# Patient Record
Sex: Female | Born: 2000 | Race: White | Hispanic: Yes | Marital: Single | Smoking: Never smoker
Health system: Southern US, Community
[De-identification: ages and names within clinical notes are randomized; demographics above are authoritative.]

---
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
## 2021-01-21 ENCOUNTER — Other Ambulatory Visit: Payer: Self-pay | Admitting: Family Medicine

## 2021-01-21 DIAGNOSIS — Z309 Encounter for contraceptive management, unspecified: Secondary | ICD-10-CM
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
## 2021-10-02 ENCOUNTER — Other Ambulatory Visit (HOSPITAL_COMMUNITY)
Admission: RE | Admit: 2021-10-02 | Discharge: 2021-10-02 | Disposition: A | Discharge status: Home / Self Care | Payer: Medicaid Other | Source: Ambulatory Visit | Attending: Family Medicine | Admitting: Family Medicine

## 2021-10-02 ENCOUNTER — Ambulatory Visit (INDEPENDENT_AMBULATORY_CARE_PROVIDER_SITE_OTHER): Payer: Medicaid Other | Admitting: Family Medicine

## 2021-10-02 ENCOUNTER — Encounter: Payer: Self-pay | Admitting: Family Medicine

## 2021-10-02 VITALS — BP 102/68 | HR 79 | Ht 60.24 in | Wt 152.0 lb

## 2021-10-02 DIAGNOSIS — Z113 Encounter for screening for infections with a predominantly sexual mode of transmission: Secondary | ICD-10-CM

## 2021-10-02 DIAGNOSIS — Z0000 Encounter for general adult medical examination without abnormal findings: Secondary | ICD-10-CM | POA: Diagnosis not present

## 2021-10-02 NOTE — Patient Instructions (Signed)

## 2021-10-03 DIAGNOSIS — Z0000 Encounter for general adult medical examination without abnormal findings: Secondary | ICD-10-CM | POA: Insufficient documentation

## 2021-10-03 LAB — RPR: RPR Ser Ql: NONREACTIVE

## 2021-10-03 LAB — HIV ANTIBODY (ROUTINE TESTING W REFLEX): HIV Screen 4th Generation wRfx: NONREACTIVE

## 2021-10-04 LAB — CERVICOVAGINAL ANCILLARY ONLY
Chlamydia: NEGATIVE
Comment: NEGATIVE
Comment: NEGATIVE
Comment: NORMAL
Neisseria Gonorrhea: NEGATIVE
Trichomonas: NEGATIVE

## 2021-10-18 ENCOUNTER — Other Ambulatory Visit: Payer: Self-pay | Admitting: Family Medicine

## 2021-10-18 DIAGNOSIS — Z309 Encounter for contraceptive management, unspecified: Secondary | ICD-10-CM

## 2021-12-01 ENCOUNTER — Ambulatory Visit (INDEPENDENT_AMBULATORY_CARE_PROVIDER_SITE_OTHER): Payer: Medicaid Other | Admitting: Student

## 2021-12-01 VITALS — BP 110/60 | HR 74 | Ht 60.0 in | Wt 147.0 lb

## 2021-12-01 DIAGNOSIS — N390 Urinary tract infection, site not specified: Secondary | ICD-10-CM

## 2021-12-01 DIAGNOSIS — R300 Dysuria: Secondary | ICD-10-CM | POA: Diagnosis not present

## 2021-12-01 HISTORY — DX: Urinary tract infection, site not specified: N39.0

## 2021-12-01 LAB — POCT URINALYSIS DIP (MANUAL ENTRY)
Bilirubin, UA: NEGATIVE
Glucose, UA: NEGATIVE mg/dL
Ketones, POC UA: NEGATIVE mg/dL
Nitrite, UA: POSITIVE — AB
Protein Ur, POC: >=300 mg/dL — AB
Spec Grav, UA: >=1.03 — AB (ref 1.010–1.025)
Urobilinogen, UA: 0.2 E.U./dL
pH, UA: 6 (ref 5.0–8.0)

## 2021-12-01 LAB — POCT UA - MICROSCOPIC ONLY: Epithelial cells, urine per micros: >20

## 2021-12-01 MED ORDER — NITROFURANTOIN MONOHYD MACRO 100 MG PO CAPS: 100.0000 mg | ORAL_CAPSULE | Freq: Two times a day (BID) | ORAL | 0 refills | Status: AC

## 2021-12-01 NOTE — Patient Instructions (Signed)
It was great to see you today! Thank you for choosing [facility] for your primary care. Gail Shaw was seen for burning with urination.  Today we addressed: I suspect this is a standard UTI (as evidenced by your urine testing and symptoms) and not bladder or kidney infection.  I prescribed you Macrobid 100 mg twice daily x5 days.  Please take all of these to ensure that the UTI resolves.  Should your symptoms not resolve or come back after finishing antibiotic treatment, please return.  If you haven't already, sign up for My Chart to have easy access to your labs results, and communication with your primary care physician.  You should return to our clinic Return if symptoms worsen or fail to improve.  Please arrive 15 minutes before your appointment to ensure smooth check in process.  We appreciate your efforts in making this happen.  Please call the clinic at [phone] if your symptoms worsen or you have any concerns.  Thank you for allowing me to participate in your care, Shelby Mattocks, DO 12/01/2021, 10:15 AM PGY-2, [facility]

## 2021-12-01 NOTE — Assessment & Plan Note (Addendum)
UA with leuks +1 and positive nitrites and clinical suspicion of UTI given history.  Will treat as uncomplicated UTI not involving upper tract with Macrobid 100 mg twice daily x5 days.

## 2021-12-01 NOTE — Progress Notes (Signed)
  SUBJECTIVE:   CHIEF COMPLAINT / HPI:   DYSURIA  Pain urinating started 5 days ago. Urinating more frequently. Denies back pain. Pain is: suprapubic, hurts during urination, burning even after urination, will wake her up some times Medications tried: baking soda with water, chamomile tea Any antibiotics in the last 30 days: no More than 3 UTIs in the last 12 months: no STD exposure: no Possibly pregnant: no  Symptoms Urgency: yes Frequency: yes Blood in urine: no Pain in back: no Fever: no Vaginal discharge: unsure Mouth Ulcers: no  PERTINENT  PMH / PSH: N/A  OBJECTIVE:  BP 110/60   Pulse 74   Ht 5' (1.524 m)   Wt 147 lb (66.7 kg)   LMP 11/17/2021   SpO2 98%   BMI 28.71 kg/m   General: NAD, pleasant, able to participate in exam Cardiac: RRR, no murmurs auscultated Respiratory: CTAB, normal WOB Abdomen: soft, non-tender, non-distended, no suprapubic or CVA tenderness Psych: Normal affect and mood  ASSESSMENT/PLAN:  Acute UTI UA with leuks +1 and positive nitrites and clinical suspicion of UTI given history.  Will treat as uncomplicated UTI not involving upper tract with Macrobid 100 mg twice daily x5 days.  Return if symptoms worsen or fail to improve. Shelby Mattocks, DO 12/01/2021, 10:18 AM PGY-2, [facility]

## 2022-02-25 ENCOUNTER — Encounter (HOSPITAL_COMMUNITY): Payer: Self-pay | Admitting: Emergency Medicine

## 2022-02-25 ENCOUNTER — Ambulatory Visit (INDEPENDENT_AMBULATORY_CARE_PROVIDER_SITE_OTHER): Payer: Medicaid Other

## 2022-02-25 ENCOUNTER — Ambulatory Visit (HOSPITAL_COMMUNITY)
Admission: EM | Admit: 2022-02-25 | Discharge: 2022-02-25 | Disposition: A | Discharge status: Home / Self Care | Payer: Medicaid Other | Attending: Family Medicine | Admitting: Family Medicine

## 2022-02-25 DIAGNOSIS — M25571 Pain in right ankle and joints of right foot: Secondary | ICD-10-CM

## 2022-02-25 MED ORDER — KETOROLAC TROMETHAMINE 30 MG/ML IJ SOLN
INTRAMUSCULAR | Status: AC
  Filled 2022-02-25: qty 1

## 2022-02-25 MED ORDER — KETOROLAC TROMETHAMINE 30 MG/ML IJ SOLN
30.0000 mg | Freq: Once | INTRAMUSCULAR | Status: AC
  Administered 2022-02-25: 30 mg via INTRAMUSCULAR

## 2022-02-25 MED ORDER — IBUPROFEN 800 MG PO TABS: 800.0000 mg | ORAL_TABLET | Freq: Three times a day (TID) | ORAL | 0 refills | Status: DC | PRN

## 2022-02-25 NOTE — ED Provider Notes (Addendum)
Gail Shaw    CSN: [id number] Arrival date & time: 02/25/22  1330      History   Chief Complaint Chief Complaint  Patient presents with   Ankle Pain    HPI Gail Shaw is a 21 y.o. female.    Ankle Pain  Here for right ankle pain.  Yesterday she was at a trampoline park, and after jumping really high she had come down wrong onto her lateral side of her right ankle.  It began to swell overnight.  It hurts to bear weight on it.  Last menstrual cycle is now.  History reviewed. No pertinent past medical history.  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 No pertinent surgical history.  OB History   No obstetric history on file.      Home Medications    Prior to Admission medications   Medication Sig Start Date End Date Taking? Authorizing Provider  ibuprofen (ADVIL) 800 MG tablet Take 1 tablet (800 mg total) by mouth every 8 (eight) hours as needed (pain). 02/25/22  Yes Barrett Henle, MD  norgestimate-ethinyl estradiol (ORTHO-CYCLEN) 0.25-35 MG-MCG tablet TAKE 1 TABLET BY MOUTH EVERY DAY 10/18/21   Simmons-Robinson, Riki Sheer, MD    Family History History reviewed. No pertinent family history.  Social History Social History   Tobacco Use   Smoking status: Never   Smokeless tobacco: Never     Allergies   Patient has no known allergies.   Review of Systems Review of Systems   Physical Exam Triage Vital Signs ED Triage Vitals  Enc Vitals Group     BP 02/25/22 1438 116/86     Pulse Rate 02/25/22 1438 81     Resp 02/25/22 1438 17     Temp 02/25/22 1438 98.1 F (36.7 C)     Temp Source 02/25/22 1438 Oral     SpO2 02/25/22 1438 97 %     Weight --      Height --      Head  Circumference --      Peak Flow --      Pain Score 02/25/22 1439 7     Pain Loc --      Pain Edu? --      Excl. in [place]? --    No data found.  Updated Vital Signs BP 116/86 (BP Location: Left Arm)   Pulse 81   Temp 98.1 F (36.7 C) (Oral)   Resp 17   SpO2 97%   Visual Acuity Right Eye Distance:   Left Eye Distance:   Bilateral Distance:    Right Eye Near:   Left Eye Near:    Bilateral Near:     Physical Exam Vitals reviewed.  Constitutional:      General: She is not in acute distress.    Appearance: She is not ill-appearing, toxic-appearing or diaphoretic.  Musculoskeletal:     Comments: There is swelling and tenderness in the the right lateral malleolus and anterior and inferior to the malleolus.  Skin:    Coloration: Skin is not jaundiced or pale.  Neurological:     General: No focal deficit present.     Mental Status: She is alert and oriented to person, place, and time.  Psychiatric:  Behavior: Behavior normal.      UC Treatments / Results  Labs (all labs ordered are listed, but only abnormal results are displayed) Labs Reviewed - No data to display  EKG   Radiology DG Ankle Complete Right  Result Date: 02/25/2022 CLINICAL DATA:  Right ankle pain, inversion injury ester day EXAM: RIGHT ANKLE - COMPLETE 3+ VIEW COMPARISON:  None FINDINGS: Abnormal soft tissue swelling overlying the lateral malleolus and beneath both malleoli. Tibiotalar joint effusion. Well corticated ossicle just below the lateral malleolus measures 0.8 by 0.4 cm, given the well corticated appearance I am skeptical that this is an acute fracture but could be from old injury or unfused ossification center. The plafond and talar dome appear intact. IMPRESSION: 1. Soft tissue swelling overlying the lateral malleolus and beneath both malleoli. 2. Tibiotalar joint effusion. 3. Well corticated ossicle just below the lateral malleolus measures 0.8 x 0.4 cm; given the well corticated appearance  I am skeptical that this is an acute fracture. 4. If pain persists despite conservative therapy, MRI may be warranted for further characterization. Electronically Signed   By: Van Clines M.D.   On: 02/25/2022 15:32    Procedures Procedures (including critical care time)  Medications Ordered in UC Medications  ketorolac (TORADOL) 30 MG/ML injection 30 mg (has no administration in time range)    Initial Impression / Assessment and Plan / UC Course  I have reviewed the triage vital signs and the nursing notes.  Pertinent labs & imaging results that were available during my care of the patient were reviewed by me and considered in my medical decision making (see chart for details).       There is a well-corticated piece of bone inferior to the lateral malleolus; radiology thinks this may be an old injury.  She has had a previous injury of her ankle, but it was not documented as a fracture in the past.  She is provided Ace wrap and crutches and pain relief.  She is given contact information for orthopedics so that if she is not improving she can see them.  Final Clinical Impressions(s) / UC Diagnoses   Final diagnoses:  Acute right ankle pain     Discharge Instructions      X-ray shows possible new fracture versus an old injury.  Ice and elevate this ankle in the next 48 hours  You have been given a shot of Toradol 30 mg today.  Take ibuprofen 800 mg--1 tab every 8 hours as needed for pain.  See orthopedics if you are not improving quickly     ED Prescriptions     Medication Sig Dispense Auth. Provider   ibuprofen (ADVIL) 800 MG tablet Take 1 tablet (800 mg total) by mouth every 8 (eight) hours as needed (pain). 21 tablet Tanette Chauca, Gwenlyn Perking, MD      PDMP not reviewed this encounter.   Barrett Henle, MD 02/25/22 1542    Barrett Henle, MD 02/25/22 [phone]

## 2022-02-25 NOTE — ED Triage Notes (Signed)
Pt presents with right ankle pain after jumping on a trampoline and landing on ankle. States pain increases when applying pressure to ankle and trying to walk. Has not taken any OTC medication for pain.

## 2022-02-25 NOTE — Discharge Instructions (Addendum)
X-ray shows possible new fracture versus an old injury.  Ice and elevate this ankle in the next 48 hours  You have been given a shot of Toradol 30 mg today.  Take ibuprofen 800 mg--1 tab every 8 hours as needed for pain.  See orthopedics if you are not improving quickly

## 2023-02-23 ENCOUNTER — Ambulatory Visit: Payer: Medicaid Other

## 2023-02-24 ENCOUNTER — Ambulatory Visit: Payer: Medicaid Other

## 2023-03-08 ENCOUNTER — Ambulatory Visit (HOSPITAL_COMMUNITY)
Admission: EM | Admit: 2023-03-08 | Discharge: 2023-03-08 | Disposition: A | Discharge status: Home / Self Care | Payer: Medicaid Other | Attending: Emergency Medicine | Admitting: Emergency Medicine

## 2023-03-08 ENCOUNTER — Encounter (HOSPITAL_COMMUNITY): Payer: Self-pay

## 2023-03-08 DIAGNOSIS — R21 Rash and other nonspecific skin eruption: Secondary | ICD-10-CM

## 2023-03-08 DIAGNOSIS — Z3202 Encounter for pregnancy test, result negative: Secondary | ICD-10-CM | POA: Diagnosis not present

## 2023-03-08 DIAGNOSIS — J069 Acute upper respiratory infection, unspecified: Secondary | ICD-10-CM | POA: Diagnosis not present

## 2023-03-08 DIAGNOSIS — R0981 Nasal congestion: Secondary | ICD-10-CM

## 2023-03-08 LAB — POCT URINE PREGNANCY: Preg Test, Ur: NEGATIVE

## 2023-03-08 MED ORDER — FLUTICASONE PROPIONATE 50 MCG/ACT NA SUSP: 2.0000 | Freq: Every day | NASAL | 0 refills | Status: DC

## 2023-03-08 MED ORDER — TRIAMCINOLONE ACETONIDE 0.1 % EX CREA: 1.0000 | TOPICAL_CREAM | Freq: Two times a day (BID) | CUTANEOUS | 0 refills | Status: AC

## 2023-03-08 NOTE — ED Provider Notes (Signed)
HPI  SUBJECTIVE:  Gail Shaw is a 22 y.o. female who presents with 2 issues: First, she reports a red, pruritic flat rash on her neck and face starting yesterday that "feels like a sunburn".  She reports textured skin over her face and states that her face feels tight.  States that her neck and face feel slightly swollen.  It is not painful.  She does not have this rash anywhere else.  Denies sun exposure.  No new cosmetics, facial wash, lotions, soaps, detergents, recent change in medications, recent antibiotics.  No tongue or lip swelling, sensation of throat swelling shut, voice changes, urticaria, wheezing or shortness of breath, nausea, vomiting, diarrhea, abdominal pain, lightheadedness, syncope.  She states the rash started several hours after eating a normal meal.  No aggravating or alleviating factors.  She also reports 4 to 5 days of nasal congestion, clear rhinorrhea, mild, intermittent headaches, itchy, watery eyes, sneezing.  States that she has felt feverish, but has not checked her temperature.  No body aches, sinus pain or pressure, upper dental pain, postnasal drip, sore throat, coughing, wheezing, shortness of breath.  She has multiple family members were sick with URI symptoms.  No known COVID or flu exposure.  She has no past medical history-no history of allergies, anaphylaxis.  LMP: October 17.  Unsure if she could be pregnant.  PCP: [facility].    History reviewed. No pertinent past medical history.  History reviewed. No pertinent surgical history.  History reviewed. No pertinent family history.  Social History   Tobacco Use   Smoking status: Never   Smokeless tobacco: Never    No current facility-administered medications for this encounter.  Current Outpatient Medications:    fluticasone (FLONASE) 50 MCG/ACT nasal spray, Place 2 sprays into both nostrils daily., Disp: 16 g, Rfl: 0   triamcinolone cream (KENALOG) 0.1 %, Apply 1  Application topically 2 (two) times daily. Apply for 2 weeks. May use on face, Disp: 30 g, Rfl: 0   norgestimate-ethinyl estradiol (ORTHO-CYCLEN) 0.25-35 MG-MCG tablet, TAKE 1 TABLET BY MOUTH EVERY DAY, Disp: 84 tablet, Rfl: 2  No Known Allergies   ROS  As noted in HPI.   Physical Exam  BP 116/79 (BP Location: Left Arm)   Pulse 82   Temp 98.2 F (36.8 C) (Oral)   Resp 18   LMP 02/20/2023   SpO2 98%   Constitutional: Well developed, well nourished, no acute distress Eyes:  EOMI, conjunctiva normal bilaterally HENT: Normocephalic, atraumatic,mucus membranes moist.  Extensive clear rhinorrhea.  Thematous, swollen turbinates.  No maxillary, frontal sinus tenderness.  No appreciable facial swelling.  Normal tonsils without exudates.  No erythema.  Positive cobblestoning, no postnasal drip.  No angioedema.  Airway widely patent. Neck: Positive cervical lymphadenopathy Respiratory: Normal inspiratory effort, lungs clear bilaterally, good air movement Cardiovascular: Normal rate, regular rhythm, no murmurs rubs or gallops GI: nondistended skin: No urticaria.  No rash over torso, arms.   Flat, blanchable, nontender rash over neck   Mild nontender erythema, textured skin over face   Musculoskeletal: no deformities Neurologic: Alert & oriented x 3, no focal neuro deficits Psychiatric: Speech and behavior appropriate   ED Course   Medications - No data to display  Orders Placed This Encounter  Procedures   POCT urine pregnancy    Standing Status:   Standing    Number of Occurrences:   1    Results for orders placed or performed during the hospital encounter of 03/08/23 (  from the past 24 hour(s))  POCT urine pregnancy     Status: None   Collection Time: 03/08/23  1:14 PM  Result Value Ref Range   Preg Test, Ur Negative Negative   No results found.  ED Clinical Impression  1. Rash   2. Upper respiratory tract infection, unspecified type   3. Nasal congestion       ED Assessment/Plan    Urine pregnancy negative.  1.  Rash.  Unsure as to the etiology, but does not appear to be a severe allergic reaction.  She has no angioedema, evidence of anaphylaxis.  Denies sun exposure.  Her vitals are normal, lungs are clear.  Will send home with triamcinolone cream.  2.  Nasal congestion-she has multiple sick contacts with URI symptoms, however, she is reporting itchy, watery eyes and sneezing.  Suspect allergies versus upper respiratory infection.  Will have her try an antihistamine/decongestant combination, and if that does not work, then switch to [employer] D in a few days.  Flonase, saline nasal irrigation.  Discussed labs, MDM, treatment plan, and plan for follow-up with patient. Discussed sn/sx that should prompt return to the ED. patient agrees with plan.   Meds ordered this encounter  Medications   triamcinolone cream (KENALOG) 0.1 %    Sig: Apply 1 Application topically 2 (two) times daily. Apply for 2 weeks. May use on face    Dispense:  30 g    Refill:  0   fluticasone (FLONASE) 50 MCG/ACT nasal spray    Sig: Place 2 sprays into both nostrils daily.    Dispense:  16 g    Refill:  0      *This clinic note was created using Scientist, clinical (histocompatibility and immunogenetics). Therefore, there may be occasional mistakes despite careful proofreading.  ?   Domenick Gong, MD 03/08/23 1317

## 2023-03-08 NOTE — Discharge Instructions (Signed)
Urine pregnancy is negative.  Apply the triamcinolone to your neck and face twice a day.  This is a steroid cream which will help with inflammation and itching.  You can try an antihistamine/decongestant commendation such as Claritin-D, Allegra-D, Zyrtec-D for the nasal congestion.  This also may help with the rash.  If the nasal congestion does not respond to antihistamine/decongestion combination in a day or 2, then switch to Mucinex D.Flonase, saline nasal irrigation with a NeilMed sinus rinse and distilled water as often as you want to prevent a bacterial sinus infection.  Go to the ER for lip or tongue swelling, nausea, vomiting, abdominal pain, diarrhea, wheezing, shortness of breath, sensation of throat swelling shut, hives all over your body, if you pass out.

## 2023-03-08 NOTE — ED Triage Notes (Signed)
Pt is here for cough and nasal congestion x 3 days. Pt reports a rash on her neck and stomach that started yesterday.

## 2023-06-18 ENCOUNTER — Encounter: Payer: Self-pay | Admitting: Student

## 2023-06-18 ENCOUNTER — Ambulatory Visit (INDEPENDENT_AMBULATORY_CARE_PROVIDER_SITE_OTHER): Payer: Medicaid Other | Admitting: Student

## 2023-06-18 VITALS — BP 98/70 | HR 103 | Temp 98.7°F | Ht 61.0 in | Wt 149.4 lb

## 2023-06-18 DIAGNOSIS — Z309 Encounter for contraceptive management, unspecified: Secondary | ICD-10-CM | POA: Diagnosis not present

## 2023-06-18 DIAGNOSIS — N6325 Unspecified lump in the left breast, overlapping quadrants: Secondary | ICD-10-CM | POA: Insufficient documentation

## 2023-06-18 DIAGNOSIS — N926 Irregular menstruation, unspecified: Secondary | ICD-10-CM

## 2023-06-18 LAB — POCT URINE PREGNANCY: Preg Test, Ur: NEGATIVE

## 2023-06-18 NOTE — Assessment & Plan Note (Signed)
Due to the patient being less than 22 years old we will order an ultrasound for workup.  Referred to the GI breast center for scheduling.  Reassured by negative family history however she does have some risk factors with early onset of menses.  Urine pregnancy checked at this visit and was negative.

## 2023-06-18 NOTE — Assessment & Plan Note (Signed)
Discussed contraception due to patient being sexually active without birth control.  Shared decision making with patient to schedule an appointment in March for Nexplanon placement.  Skin benefits discussed with patient and she plans to proceed with procedure.

## 2023-06-18 NOTE — Patient Instructions (Addendum)
It was great to see you today!   I am scheduling an ultrasound at [facility].   You are scheduled to come see me in March to get your nexplanon placed and to complete your pap smear.   Future Appointments  Date Time Provider Department Center  07/02/2023  2:10 PM Glendale Chard, DO [facility]    Please arrive 15 minutes before your appointment to ensure smooth check in process.    Please call the clinic at [phone] if your symptoms worsen or you have any concerns.  Thank you for allowing me to participate in your care, Dr. Glendale Chard [facility]

## 2023-06-18 NOTE — Progress Notes (Signed)
    SUBJECTIVE:   CHIEF COMPLAINT / HPI:   Gail Shaw is a 23 y.o. female  presenting for breast lump.   Found the lump 2 months ago. Seems to be growing. Patient thinks its a cyst. Started birth control 3 years ago, took for one year and then been off for 2 years.   Changes in breast appearance: no New skin changes: no New nipple inversion: no Nipple discharge: no Breast pain location or changes with menses: does not seem to be related to menstrual cycle and is not painful  Trauma: no  Hx of familial breast cancer: no Contraceptive pills: Not currently on contraception Age of first period: 11  Currently sexually active and not on birth control.  PERTINENT  PMH / PSH: Reviewed and updated   OBJECTIVE:   BP 98/70   Pulse (!) 103   Temp 98.7 F (37.1 C)   Ht 5\' 1"  (1.549 m)   Wt 149 lb 6.4 oz (67.8 kg)   SpO2 97%   BMI 28.23 kg/m   Well-appearing, no acute distress Cardio: Regular rate, regular  rhythm, no murmurs on exam. Pulm: Clear, no wheezing, no crackles. No increased work of breathing Abdominal: bowel sounds present, soft, non-tender, non-distended Extremities: no peripheral edema   Breast Exam: MA present.  Both breast examined without skin findings, nipple discharge or nipple inversion.  A 1 to 2 cm lump was found in the 3 o'clock position on the outer left breast that was mobile and nontender to the touch.     12/01/2021    9:50 AM 10/02/2021    3:56 PM 10/19/2020    9:13 AM  PHQ9 SCORE ONLY  PHQ-9 Total Score 1 2 0      ASSESSMENT/PLAN:   Mass overlapping multiple quadrants of left breast Due to the patient being less than 68 years old we will order an ultrasound for workup.  Referred to the GI breast center for scheduling.  Reassured by negative family history however she does have some risk factors with early onset of menses.  Urine pregnancy checked at this visit and was negative.  Contraception management Discussed contraception due  to patient being sexually active without birth control.  Shared decision making with patient to schedule an appointment in March for Nexplanon placement.  Skin benefits discussed with patient and she plans to proceed with procedure.   Health maintenance: At time of Nexplanon placement will also perform Pap smear.  Glendale Chard, DO [facility]

## 2023-06-19 ENCOUNTER — Encounter: Payer: Self-pay | Admitting: Student

## 2023-06-19 ENCOUNTER — Ambulatory Visit
Admission: RE | Admit: 2023-06-19 | Discharge: 2023-06-19 | Discharge status: Home / Self Care | Payer: Medicaid Other | Source: Ambulatory Visit | Attending: Family Medicine | Admitting: Family Medicine

## 2023-06-19 DIAGNOSIS — N6325 Unspecified lump in the left breast, overlapping quadrants: Secondary | ICD-10-CM | POA: Diagnosis not present

## 2023-06-22 ENCOUNTER — Other Ambulatory Visit: Payer: Self-pay | Admitting: Family Medicine

## 2023-06-22 DIAGNOSIS — D242 Benign neoplasm of left breast: Secondary | ICD-10-CM

## 2023-07-02 ENCOUNTER — Other Ambulatory Visit (HOSPITAL_COMMUNITY)
Admission: RE | Admit: 2023-07-02 | Discharge: 2023-07-02 | Disposition: A | Discharge status: Home / Self Care | Source: Ambulatory Visit | Attending: Family Medicine | Admitting: Family Medicine

## 2023-07-02 ENCOUNTER — Ambulatory Visit: Payer: Self-pay | Admitting: Student

## 2023-07-02 VITALS — BP 113/58 | HR 70 | Ht 61.0 in | Wt 148.4 lb

## 2023-07-02 DIAGNOSIS — Z124 Encounter for screening for malignant neoplasm of cervix: Secondary | ICD-10-CM

## 2023-07-02 DIAGNOSIS — Z113 Encounter for screening for infections with a predominantly sexual mode of transmission: Secondary | ICD-10-CM

## 2023-07-02 DIAGNOSIS — Z309 Encounter for contraceptive management, unspecified: Secondary | ICD-10-CM

## 2023-07-02 LAB — POCT URINE PREGNANCY: Preg Test, Ur: NEGATIVE

## 2023-07-02 MED ORDER — NORGESTIMATE-ETH ESTRADIOL 0.25-35 MG-MCG PO TABS: 1.0000 | ORAL_TABLET | Freq: Every day | ORAL | 2 refills | Status: AC

## 2023-07-02 NOTE — Assessment & Plan Note (Signed)
 Shared decision making with patient to restart OCPs after lengthy discussion on her options. Prescription sent to her pharmacy. She will need follow up in one year.

## 2023-07-02 NOTE — Progress Notes (Signed)
    SUBJECTIVE:   CHIEF COMPLAINT / HPI:   Gail Shaw is a 23 y.o. female  presenting for pap smear and birth control counseling.   Currently sexually active not using birth control. Is not desiring pregnancy at this time. Has previously been on OCPs. Last visit discussed nexplanon but patient has changed her mind and does not wish to proceed.   PERTINENT  PMH / PSH: Reviewed and updated   OBJECTIVE:   BP (!) 113/58   Pulse 70   Ht 5\' 1"  (1.549 m)   Wt 148 lb 6.4 oz (67.3 kg)   SpO2 100%   BMI 28.04 kg/m   Well-appearing, no acute distress Cardio: Regular rate, regular rhythm, no murmurs on exam. Pulm: Clear, no wheezing, no crackles. No increased work of breathing Abdominal: bowel sounds present, soft, non-tender, non-distended Extremities: no peripheral edema   Pelvic Exam: MA chaperone present  Normal external genitalia No abnormal discharge  No cervical motion tenderness  Cervix visualized with no lesions       07/02/2023    2:39 PM 12/01/2021    9:50 AM 10/02/2021    3:56 PM  PHQ9 SCORE ONLY  PHQ-9 Total Score 0 1 2      ASSESSMENT/PLAN:   Contraception management Shared decision making with patient to restart OCPs after lengthy discussion on her options. Prescription sent to her pharmacy. She will need follow up in one year.    Health Maintenance:  Pap smear collected today GC, HIV and RPR ordered today   Glendale Chard, DO [facility]

## 2023-07-02 NOTE — Patient Instructions (Signed)
 It was great to see you today!   I will send you a message with your results to MyChart.   I have sent a prescription of oral birth control pills to your pharmacy.   No future appointments.  Please arrive 15 minutes before your appointment to ensure smooth check in process.    Please call the clinic at [phone] if your symptoms worsen or you have any concerns.  Thank you for allowing me to participate in your care, Dr. Glendale Chard [facility]

## 2023-07-03 LAB — CERVICOVAGINAL ANCILLARY ONLY
Chlamydia: NEGATIVE
Comment: NEGATIVE
Comment: NORMAL
Neisseria Gonorrhea: NEGATIVE

## 2023-07-04 ENCOUNTER — Encounter: Payer: Self-pay | Admitting: Student

## 2023-07-04 LAB — CYTOLOGY - PAP
Adequacy: ABSENT
Diagnosis: NEGATIVE

## 2023-12-19 ENCOUNTER — Other Ambulatory Visit: Payer: Self-pay | Admitting: Family Medicine

## 2023-12-19 ENCOUNTER — Ambulatory Visit
Admission: RE | Admit: 2023-12-19 | Discharge: 2023-12-19 | Disposition: A | Discharge status: Home / Self Care | Source: Ambulatory Visit | Attending: Family Medicine | Admitting: Family Medicine

## 2023-12-19 DIAGNOSIS — D242 Benign neoplasm of left breast: Secondary | ICD-10-CM

## 2023-12-19 DIAGNOSIS — N6325 Unspecified lump in the left breast, overlapping quadrants: Secondary | ICD-10-CM | POA: Diagnosis not present

## 2024-05-14 ENCOUNTER — Other Ambulatory Visit (HOSPITAL_COMMUNITY)
Admission: RE | Admit: 2024-05-14 | Discharge: 2024-05-14 | Disposition: A | Source: Ambulatory Visit | Attending: Family Medicine | Admitting: Family Medicine

## 2024-05-14 ENCOUNTER — Ambulatory Visit: Payer: Self-pay | Admitting: Family Medicine

## 2024-05-14 VITALS — BP 103/69 | HR 75 | Ht 61.0 in | Wt 148.6 lb

## 2024-05-14 DIAGNOSIS — N946 Dysmenorrhea, unspecified: Secondary | ICD-10-CM | POA: Diagnosis not present

## 2024-05-14 DIAGNOSIS — B079 Viral wart, unspecified: Secondary | ICD-10-CM

## 2024-05-14 DIAGNOSIS — Z0000 Encounter for general adult medical examination without abnormal findings: Secondary | ICD-10-CM | POA: Diagnosis present

## 2024-05-14 DIAGNOSIS — Z23 Encounter for immunization: Secondary | ICD-10-CM | POA: Diagnosis present

## 2024-05-14 LAB — POCT GLYCOSYLATED HEMOGLOBIN (HGB A1C): Hemoglobin A1C: 5.1 % (ref 4.0–5.6)

## 2024-05-14 NOTE — Addendum Note (Signed)
 Addended by: THEOPHILUS PAGAN on: 05/14/2024 04:46 PM   Modules accepted: Orders

## 2024-05-14 NOTE — Patient Instructions (Addendum)
 It was wonderful to see you today! Thank you for choosing [facility].   Please bring ALL of your medications with you to every visit.   Today we talked about:  We are getting some annual blood work today and you will see these results available on Mychart.  I also gave you vaccinations that you are due for. You do have a couple of warts on your hand the best therapy for this is to do cryotherapy which is where we burn them off.  As we discussed this can be painful especially on your hand.  If you would like to discuss having this done please schedule follow-up appointment and we can do it in the office. If you would like to consider inserting the Nexplanon again please follow-up in our office and we can do the procedure pretty quickly.  As we discussed its contraception for at least 3 years and the biggest side effect is that you can have some irregular bleeding that is worse in the first 3 months.  As we discussed please take a prenatal vitamin if you are sexually active and not taking your oral birth control regularly as you could still get pregnant.  Please follow up in 1 year or sooner for concerns as above   We are checking some labs today. If they are abnormal, I will call you. If they are normal, I will send you a MyChart message (if it is active) or a letter in the mail. If you do not hear about your labs in the next 2 weeks, please call the office.  Call the clinic at [phone] if your symptoms worsen or you have any concerns.  Please be sure to schedule follow up at the front desk before you leave today.   Izetta Nap, DO Family Medicine

## 2024-05-14 NOTE — Progress Notes (Signed)
" ° ° °  SUBJECTIVE:   Chief compliant/HPI: annual examination  Gail Shaw is a 24 y.o. who presents today for an annual exam.   Discussed the use of AI scribe software for clinical note transcription with the patient, who gave verbal consent to proceed.  Left hand lesions - Lesions present on hand for several years - Feels they have spread over time - Has not trialed any treatment for previously  Contraceptive management - Currently using oral contraceptive pills, reports missing some doses - Interested in switching to Nexplanon implant - No vaginal discharge or other genitourinary symptoms - Requests STI testing, including gonorrhea and chlamydia  Updated history tabs and problem list.   OBJECTIVE:   BP 103/69   Pulse 75   Ht 5' 1 (1.549 m)   Wt 148 lb 9.6 oz (67.4 kg)   LMP 04/29/2024   SpO2 100%   BMI 28.08 kg/m   General: Well-appearing. Alert. NAD HEENT: Normocephalic. White sclera. TM clear bilaterally. No rhinorrhea or congestion CV: RRR without murmur Pulm: CTAB. Normal WOB on RA. No wheezing Abdomen: Soft, non-tender, non-distended. +BS Ext: Well perfused. Cap refill < 3 seconds Skin: Warm, dry.  To approximately 1 mm flesh-colored papules present on the left palm and similar lesion on PIP joint of left thumb      ASSESSMENT/PLAN:   Assessment & Plan Dysmenorrhea Occasional heavy cycles with irregular OCP use.  Considering transitioning to Nexplanon, will schedule back for insertion if desired. 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 - Schedule for cryotherapy if desired  Annual Examination  See AVS for age appropriate recommendations.   PHQ score 0, reviewed and discussed. Blood pressure reviewed and at goal.  The patient currently uses OCPs for contraception. Folate recommended as appropriate, minimum of 400 mcg per day.    Considered the  following items based upon USPSTF recommendations: HIV testing:ordered Hepatitis C: ordered Hepatitis B:discussed and declined Syphilis if at high risk: ordered GC/CT 24 or  younger, ordered. Lipid panel (nonfasting or fasting) discussed based upon AHA recommendations and ordered.  Consider repeat every 4-6 years.  Reviewed risk factors for latent tuberculosis and not indicated.  Discussed family history, BRCA testing not indicated. Cervical cancer screening: prior Pap reviewed, repeat due in 2028 Immunizations: Received flu, men B and Tdap vaccines MyChart Activation:Already signed up  Follow up in 1 year or sooner if indicated.    Izetta Nap, MD [facility]    "

## 2024-05-15 ENCOUNTER — Ambulatory Visit: Payer: Self-pay | Admitting: Family Medicine

## 2024-05-15 LAB — BASIC METABOLIC PANEL WITH GFR
BUN/Creatinine Ratio: 26 — ABNORMAL HIGH (ref 9–23)
BUN: 17 mg/dL (ref 6–20)
CO2: 24 mmol/L (ref 20–29)
Calcium: 9.4 mg/dL (ref 8.7–10.2)
Chloride: 102 mmol/L (ref 96–106)
Creatinine, Ser: 0.65 mg/dL (ref 0.57–1.00)
Glucose: 76 mg/dL (ref 70–99)
Potassium: 3.8 mmol/L (ref 3.5–5.2)
Sodium: 140 mmol/L (ref 134–144)
eGFR: 128 mL/min/1.73

## 2024-05-15 LAB — LIPID PANEL
Chol/HDL Ratio: 4.8 ratio — ABNORMAL HIGH (ref 0.0–4.4)
Cholesterol, Total: 173 mg/dL (ref 100–199)
HDL: 36 mg/dL — ABNORMAL LOW
LDL Chol Calc (NIH): 101 mg/dL — ABNORMAL HIGH (ref 0–99)
Triglycerides: 209 mg/dL — ABNORMAL HIGH (ref 0–149)
VLDL Cholesterol Cal: 36 mg/dL (ref 5–40)

## 2024-05-15 LAB — CBC
Hematocrit: 43.7 % (ref 34.0–46.6)
Hemoglobin: 14.3 g/dL (ref 11.1–15.9)
MCH: 29.6 pg (ref 26.6–33.0)
MCHC: 32.7 g/dL (ref 31.5–35.7)
MCV: 91 fL (ref 79–97)
Platelets: 303 x10E3/uL (ref 150–450)
RBC: 4.83 x10E6/uL (ref 3.77–5.28)
RDW: 12.4 % (ref 11.7–15.4)
WBC: 7.2 x10E3/uL (ref 3.4–10.8)

## 2024-05-15 LAB — HEPATITIS C ANTIBODY: Hep C Virus Ab: NONREACTIVE

## 2024-05-15 LAB — CERVICOVAGINAL ANCILLARY ONLY
Chlamydia: NEGATIVE
Comment: NEGATIVE
Comment: NEGATIVE
Comment: NORMAL
Neisseria Gonorrhea: NEGATIVE
Trichomonas: NEGATIVE

## 2024-05-15 LAB — HIV ANTIBODY (ROUTINE TESTING W REFLEX): HIV Screen 4th Generation wRfx: NONREACTIVE

## 2024-05-15 LAB — FERRITIN: Ferritin: 123 ng/mL (ref 15–150)

## 2024-06-19 ENCOUNTER — Other Ambulatory Visit
